# Patient Record
Sex: Female | Born: 1975 | Race: Black or African American | Hispanic: No | Marital: Single | Smoking: Never smoker
Health system: Southern US, Community
[De-identification: ages and names within clinical notes are randomized; demographics above are authoritative.]

---

## 2004-04-27 ENCOUNTER — Other Ambulatory Visit: Admission: RE | Admit: 2004-04-27 | Discharge: 2004-04-27 | Payer: Self-pay

## 2005-06-21 ENCOUNTER — Other Ambulatory Visit: Admission: RE | Admit: 2005-06-21 | Discharge: 2005-06-21 | Payer: Self-pay | Admitting: Unknown Physician Specialty

## 2005-06-21 ENCOUNTER — Encounter (INDEPENDENT_AMBULATORY_CARE_PROVIDER_SITE_OTHER): Payer: Self-pay | Admitting: Specialist

## 2006-10-09 ENCOUNTER — Emergency Department (HOSPITAL_COMMUNITY): Admission: EM | Admit: 2006-10-09 | Discharge: 2006-10-09 | Payer: Self-pay | Admitting: Family Medicine

## 2006-10-10 ENCOUNTER — Emergency Department (HOSPITAL_COMMUNITY): Admission: EM | Admit: 2006-10-10 | Discharge: 2006-10-11 | Payer: Self-pay | Admitting: Emergency Medicine

## 2006-10-11 ENCOUNTER — Emergency Department (HOSPITAL_COMMUNITY): Admission: EM | Admit: 2006-10-11 | Discharge: 2006-10-11 | Payer: Self-pay | Admitting: Emergency Medicine

## 2006-10-30 ENCOUNTER — Encounter: Admission: RE | Admit: 2006-10-30 | Discharge: 2006-10-30 | Payer: Self-pay | Admitting: Otolaryngology

## 2006-11-05 ENCOUNTER — Emergency Department (HOSPITAL_COMMUNITY): Admission: EM | Admit: 2006-11-05 | Discharge: 2006-11-05 | Payer: Self-pay | Admitting: Emergency Medicine

## 2006-11-30 ENCOUNTER — Encounter: Admission: RE | Admit: 2006-11-30 | Discharge: 2006-11-30 | Payer: Self-pay | Admitting: Otolaryngology

## 2006-11-30 ENCOUNTER — Other Ambulatory Visit: Admission: RE | Admit: 2006-11-30 | Discharge: 2006-11-30 | Payer: Self-pay | Admitting: Interventional Radiology

## 2006-11-30 ENCOUNTER — Encounter (INDEPENDENT_AMBULATORY_CARE_PROVIDER_SITE_OTHER): Payer: Self-pay | Admitting: Interventional Radiology

## 2007-02-06 ENCOUNTER — Emergency Department (HOSPITAL_COMMUNITY): Admission: EM | Admit: 2007-02-06 | Discharge: 2007-02-06 | Payer: Self-pay | Admitting: Emergency Medicine

## 2007-02-16 ENCOUNTER — Emergency Department (HOSPITAL_COMMUNITY): Admission: EM | Admit: 2007-02-16 | Discharge: 2007-02-16 | Payer: Self-pay | Admitting: Family Medicine

## 2007-03-09 ENCOUNTER — Ambulatory Visit: Payer: Self-pay | Admitting: Cardiology

## 2007-03-09 LAB — CONVERTED CEMR LAB
Free T4: 0.8 ng/dL (ref 0.6–1.6)
TSH: 0.8 microintl units/mL (ref 0.35–5.50)

## 2007-03-13 ENCOUNTER — Ambulatory Visit: Payer: Self-pay | Admitting: Cardiology

## 2007-03-14 ENCOUNTER — Ambulatory Visit: Payer: Self-pay | Admitting: Internal Medicine

## 2007-03-27 ENCOUNTER — Ambulatory Visit: Payer: Self-pay

## 2007-04-02 ENCOUNTER — Emergency Department (HOSPITAL_COMMUNITY): Admission: EM | Admit: 2007-04-02 | Discharge: 2007-04-02 | Payer: Self-pay | Admitting: Emergency Medicine

## 2007-04-04 ENCOUNTER — Ambulatory Visit: Payer: Self-pay | Admitting: Cardiology

## 2007-04-25 ENCOUNTER — Ambulatory Visit: Payer: Self-pay | Admitting: *Deleted

## 2007-05-18 ENCOUNTER — Ambulatory Visit: Payer: Self-pay | Admitting: Family Medicine

## 2007-06-15 ENCOUNTER — Emergency Department (HOSPITAL_COMMUNITY): Admission: EM | Admit: 2007-06-15 | Discharge: 2007-06-16 | Payer: Self-pay | Admitting: Emergency Medicine

## 2007-07-11 ENCOUNTER — Emergency Department (HOSPITAL_COMMUNITY): Admission: EM | Admit: 2007-07-11 | Discharge: 2007-07-11 | Payer: Self-pay | Admitting: Emergency Medicine

## 2007-07-16 ENCOUNTER — Ambulatory Visit: Payer: Self-pay | Admitting: Internal Medicine

## 2007-07-16 LAB — CONVERTED CEMR LAB
ALT: 8 units/L (ref 0–35)
Alkaline Phosphatase: 41 units/L (ref 39–117)
Basophils Absolute: 0 10*3/uL (ref 0.0–0.1)
CO2: 24 meq/L (ref 19–32)
Eosinophils Absolute: 0.2 10*3/uL (ref 0.0–0.7)
Eosinophils Relative: 4 % (ref 0–5)
HCT: 36.9 % (ref 36.0–46.0)
Lymphocytes Relative: 44 % (ref 12–46)
Neutrophils Relative %: 44 % (ref 43–77)
Platelets: 312 10*3/uL (ref 150–400)
RDW: 14.4 % (ref 11.5–15.5)
Sodium: 140 meq/L (ref 135–145)
Total Bilirubin: 0.3 mg/dL (ref 0.3–1.2)
Total Protein: 7.7 g/dL (ref 6.0–8.3)

## 2007-07-17 ENCOUNTER — Ambulatory Visit (HOSPITAL_COMMUNITY): Admission: RE | Admit: 2007-07-17 | Discharge: 2007-07-17 | Payer: Self-pay | Admitting: Internal Medicine

## 2007-08-19 ENCOUNTER — Emergency Department (HOSPITAL_COMMUNITY): Admission: EM | Admit: 2007-08-19 | Discharge: 2007-08-19 | Payer: Self-pay | Admitting: Emergency Medicine

## 2007-10-19 ENCOUNTER — Ambulatory Visit: Payer: Self-pay | Admitting: Cardiology

## 2008-02-06 ENCOUNTER — Ambulatory Visit: Payer: Self-pay | Admitting: Internal Medicine

## 2008-02-06 ENCOUNTER — Encounter: Payer: Self-pay | Admitting: Internal Medicine

## 2008-02-20 ENCOUNTER — Ambulatory Visit (HOSPITAL_COMMUNITY): Admission: RE | Admit: 2008-02-20 | Discharge: 2008-02-20 | Payer: Self-pay | Admitting: Otolaryngology

## 2008-09-20 ENCOUNTER — Emergency Department (HOSPITAL_COMMUNITY): Admission: EM | Admit: 2008-09-20 | Discharge: 2008-09-20 | Payer: Self-pay | Admitting: Emergency Medicine

## 2008-09-27 ENCOUNTER — Emergency Department (HOSPITAL_COMMUNITY): Admission: EM | Admit: 2008-09-27 | Discharge: 2008-09-27 | Payer: Self-pay | Admitting: Family Medicine

## 2008-11-20 IMAGING — CT CT ANGIO CHEST
2 of 4 series · 19 of 36 positions shown · IV contrast (APPLIED)
Comparison: None.

CLINICAL DATA: 30-year-old, chest pain. 
 CT ANGIOGRAPHY OF CHEST:
TECHNIQUE: Multidetector CT imaging of the chest was performed during bolus injection of intravenous contrast.  Multiplanar CT angiographic image reconstructions were generated to evaluate the vascular anatomy.
 Contrast:  100 cc Omnipaque 300.

[Series 4: pulm embolism 2.0 st · axial · 0.62mm/px · z∈[+1082,+1384]mm · 16 of 165 slices shown]
[im 7/165  lung]
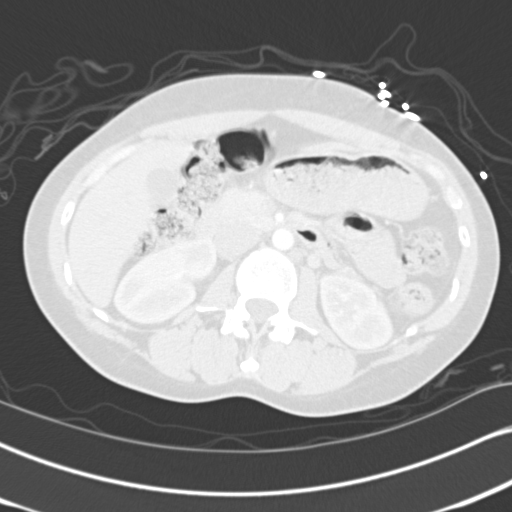
[im 20/165  mediastinal]
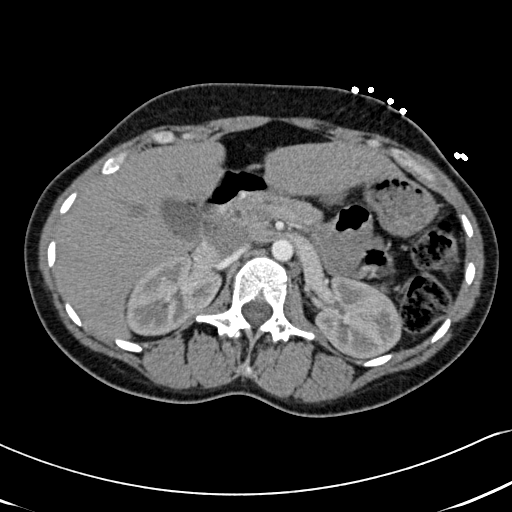
[im 27/165  lung]
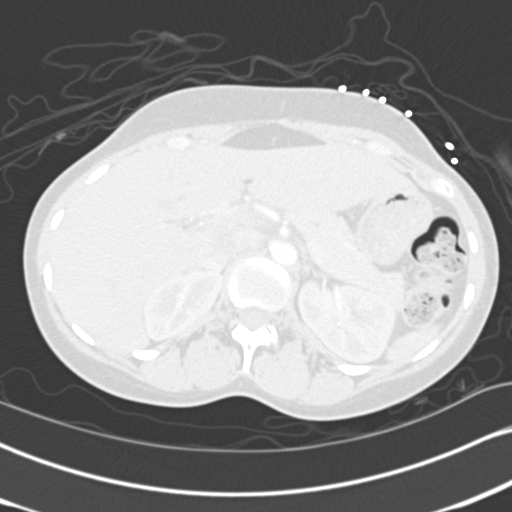
[im 40/165  mediastinal]
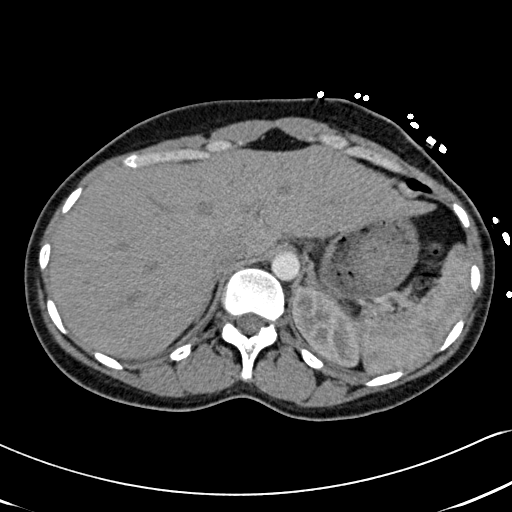
[im 46/165  lung]
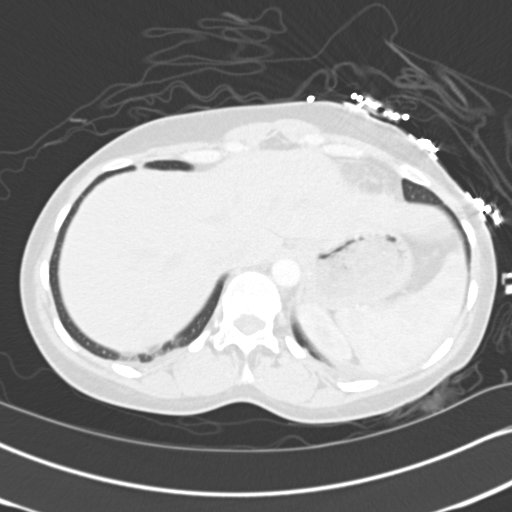
[im 60/165  mediastinal]
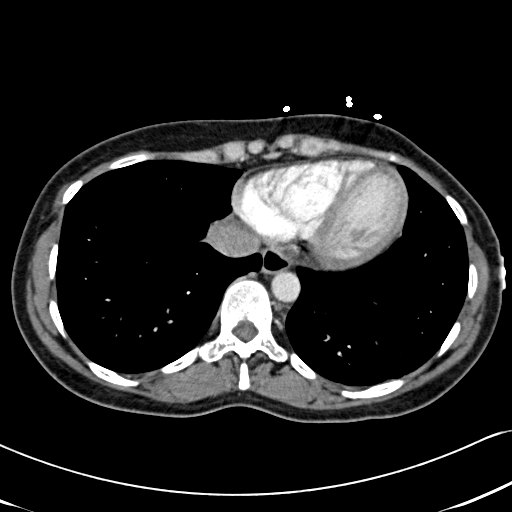
[im 66/165  lung]
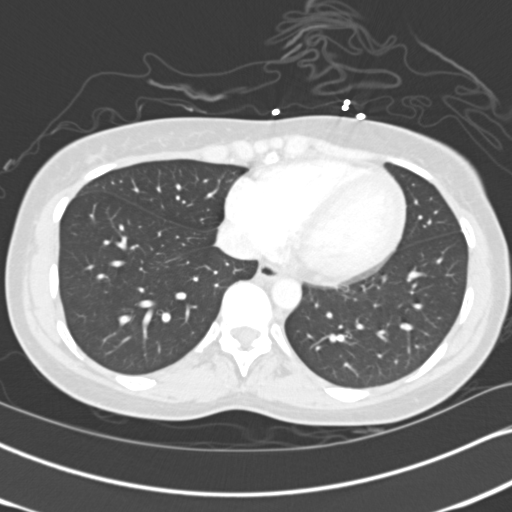
[im 79/165  mediastinal]
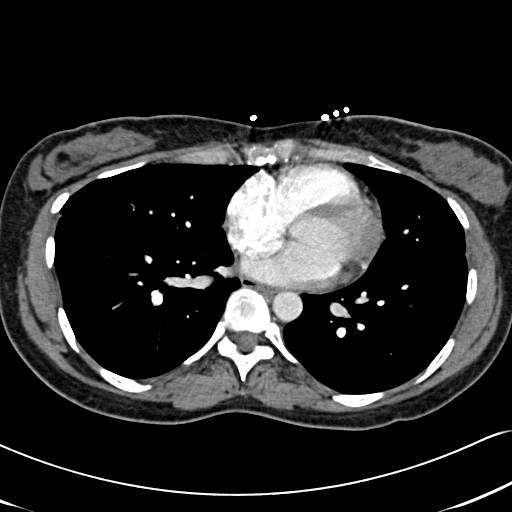
[im 86/165  lung]
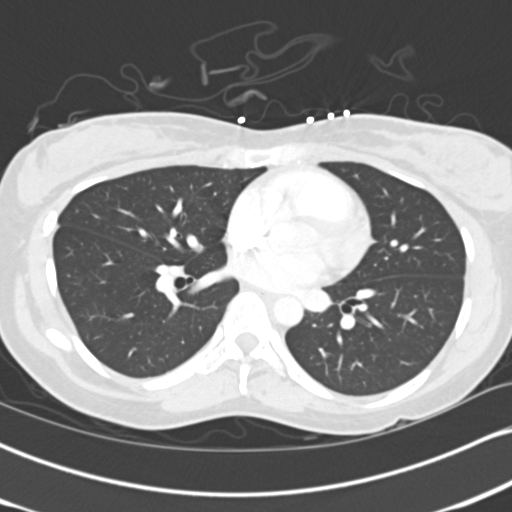
[im 99/165  mediastinal]
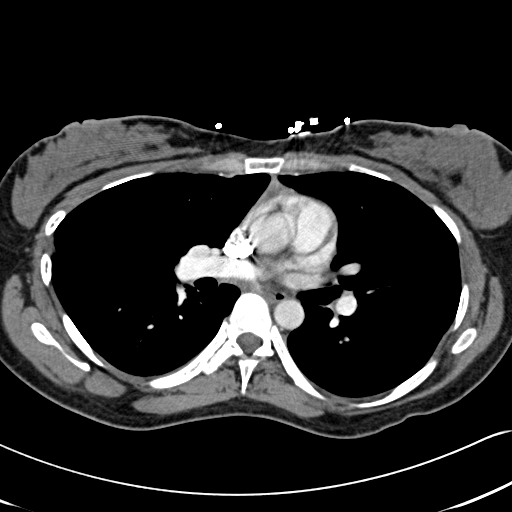
[im 105/165  lung]
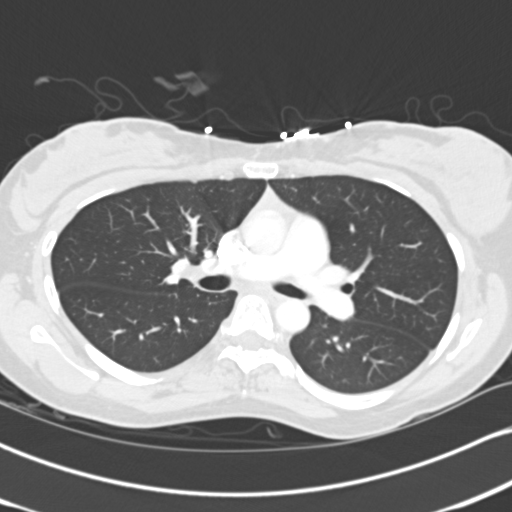
[im 119/165  mediastinal]
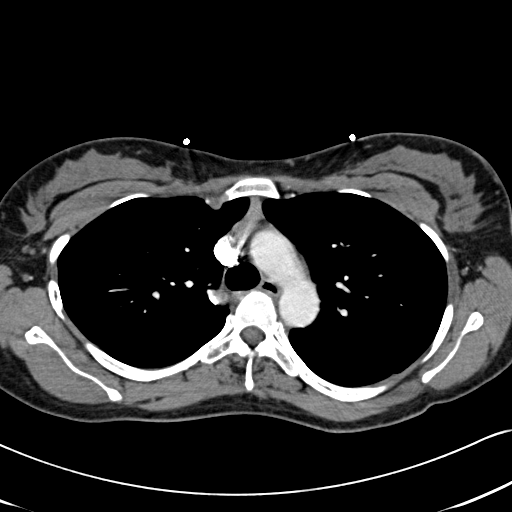
[im 125/165  lung]
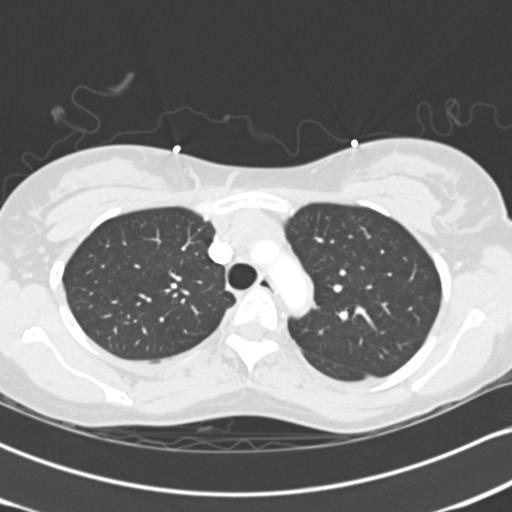
[im 138/165  mediastinal]
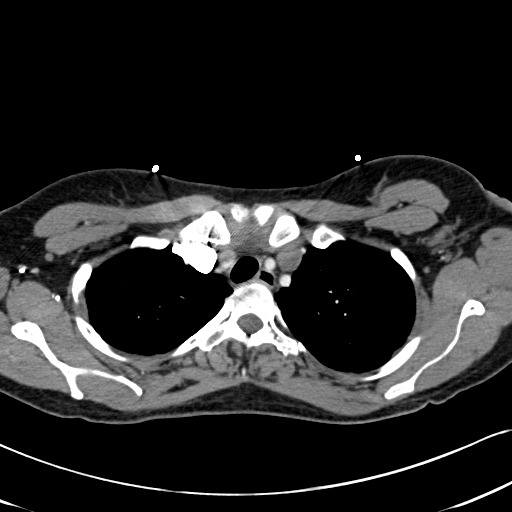
[im 145/165  lung]
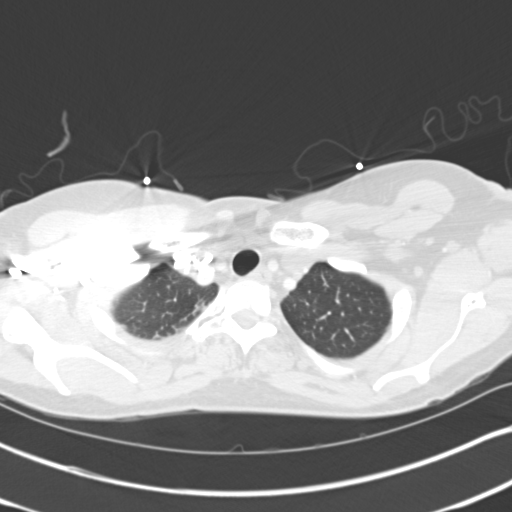
[im 158/165  mediastinal]
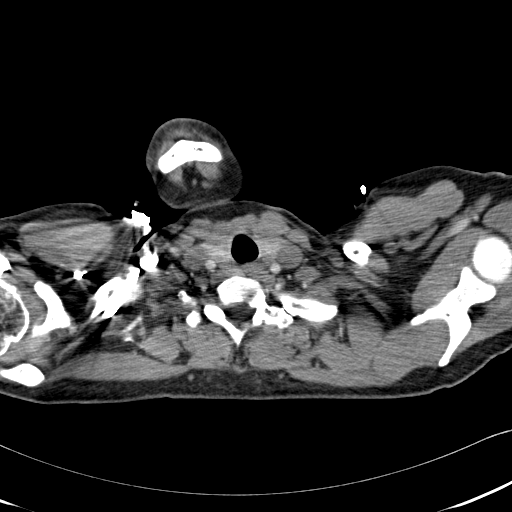

[Series 7: pulm embolism 2.0 cor · coronal · 0.64mm/px · 3 of 92 slices shown]
[im 19/92  mediastinal]
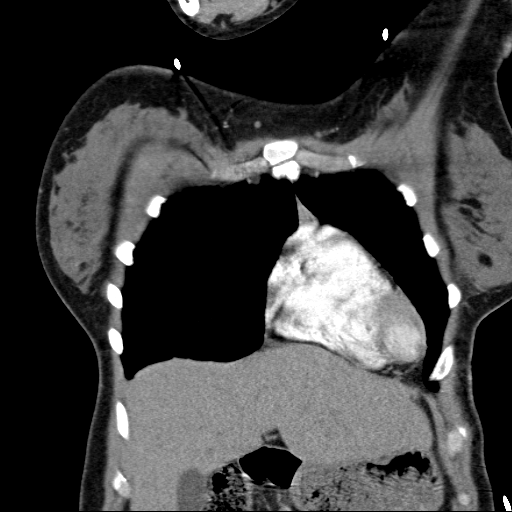
[im 37/92  mediastinal]
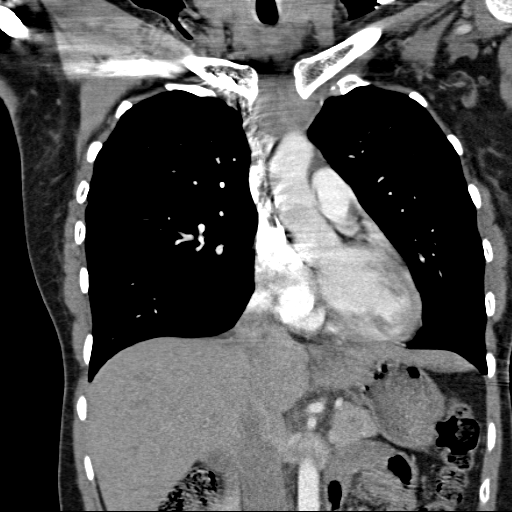
[im 55/92  mediastinal]
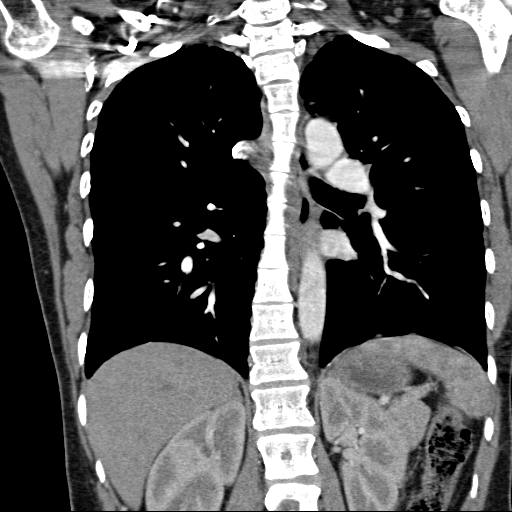

[19 of 36 positions shown; findings below may reference images not displayed]

FINDINGS: The chest wall, soft tissues, and bony structures are unremarkable.  No supraclavicular axillary adenopathy.  A right thyroid lesion is noted.  I would recommend ultrasound follow-up.  Maximum measurement is 13 mm.  
 Minimal residual thymic tissue in the anterior mediastinum.  Heart size is normal.  No pericardial effusion.  No mediastinal or hilar adenopathy.  Esophagus is grossly normal. 
 The aorta is normal in caliber.  No dissection. 
 The pulmonary arterial tree is well opacified.  No filling defects to suggest pulmonary emboli.  
 The lungs are clear of acute process.  Minimal apical scarring type change on the right side and minimal dependent atelectasis.  
 The upper abdomen demonstrates no significant findings.
IMPRESSION: 1.  No CT evidence for pulmonary emboli. 
 2.  No acute pulmonary findings. 
 3.  Normal thoracic aorta.
 4.  Right thyroid lobe lesion.  Recommend ultrasound correlation and follow-up.

## 2008-12-09 IMAGING — US US SOFT TISSUE HEAD/NECK
1 series · 14 of 25 positions shown · non-contrast
Comparison: No prior ultrasound.

CLINICAL DATA: Thyroid mass felt on exam. 
 THYROID ULTRASOUND:
TECHNIQUE: Ultrasound examination of the thyroid gland and adjacent soft tissue structures was performed.

[Series 1: us soft tissue head/neck · 0.07mm/px · 14 of 56 slices shown]
[im 1/56]
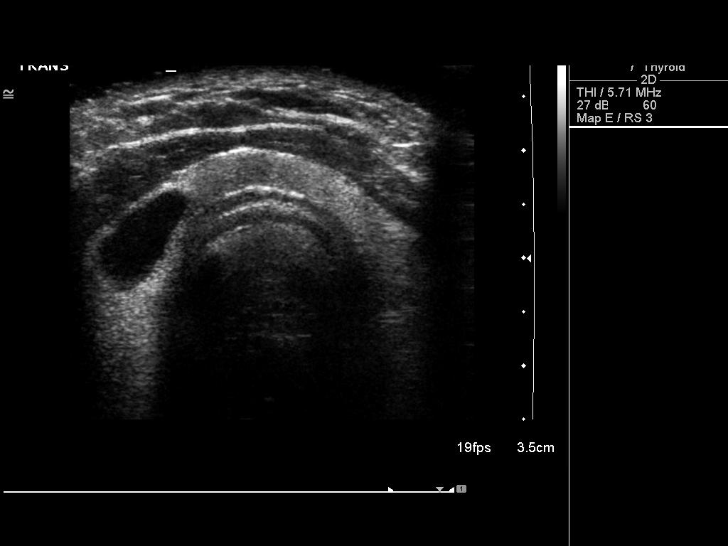
[im 5/56]
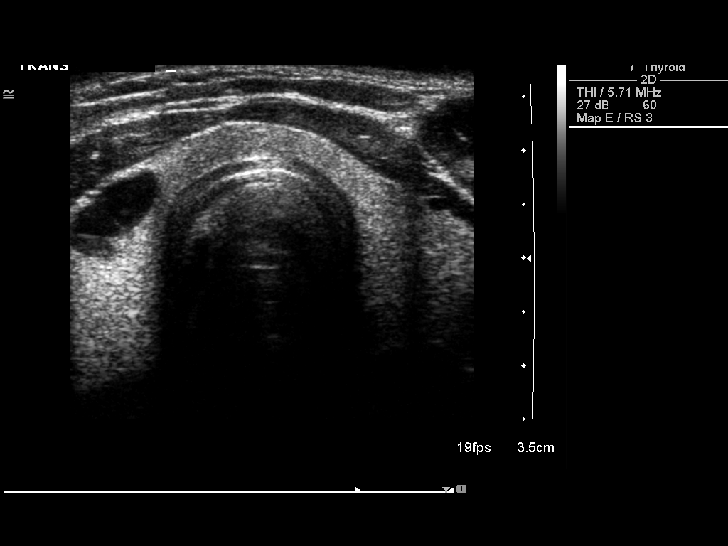
[im 10/56]
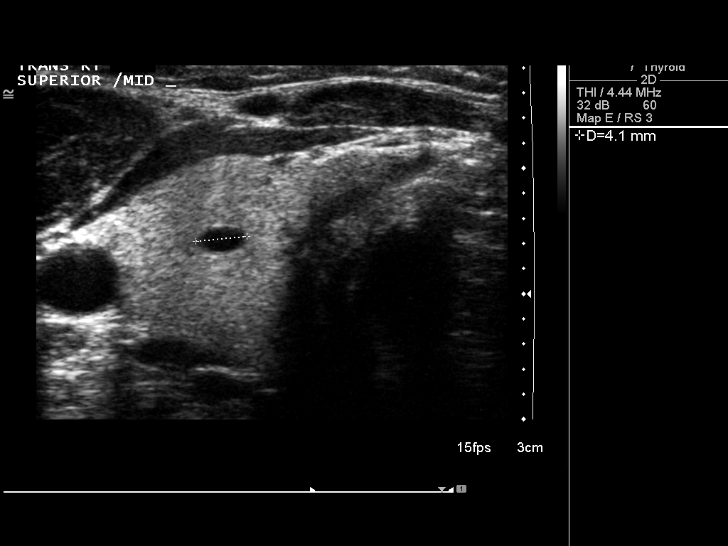
[im 14/56]
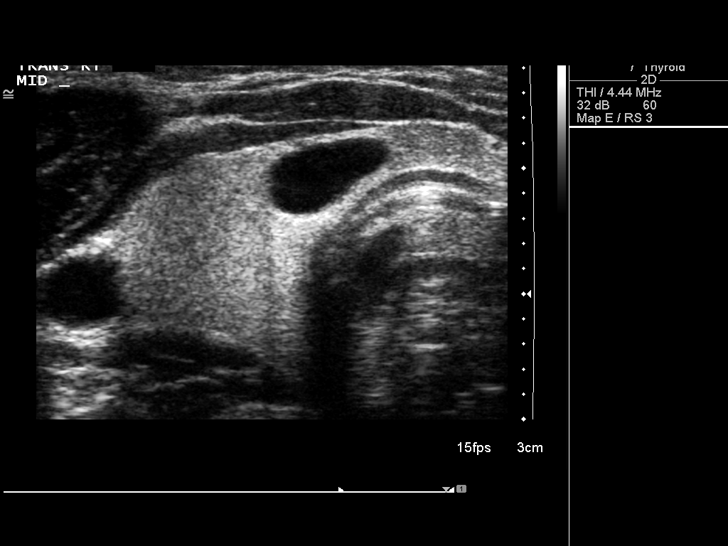
[im 19/56]
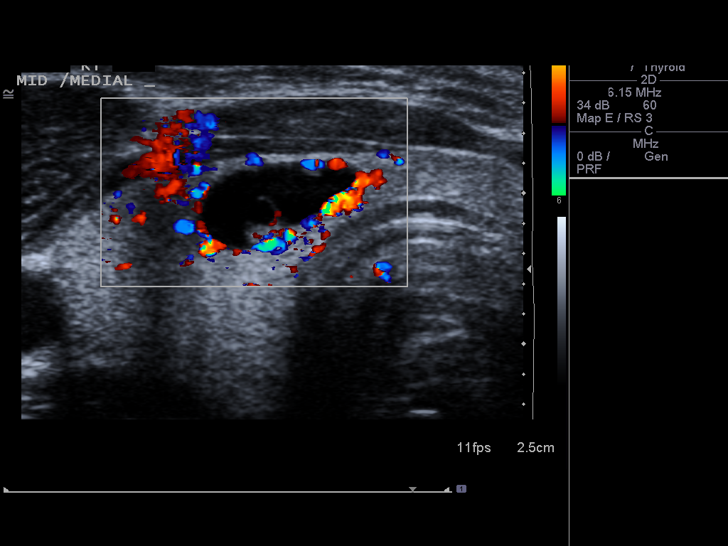
[im 21/56]
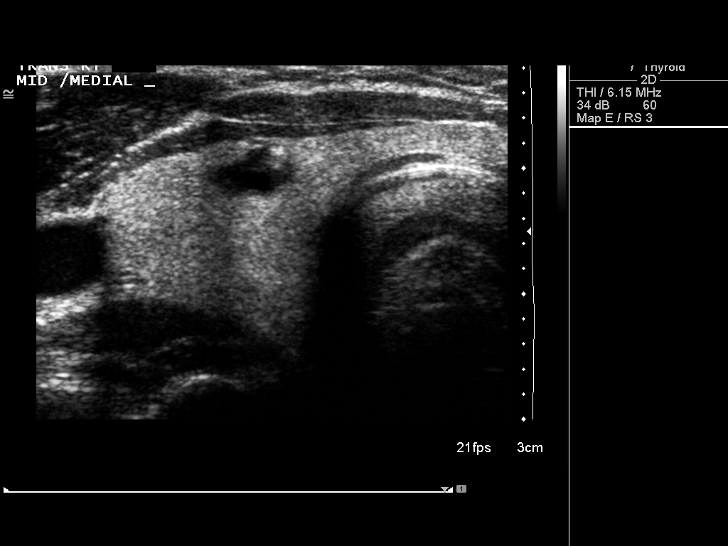
[im 26/56]
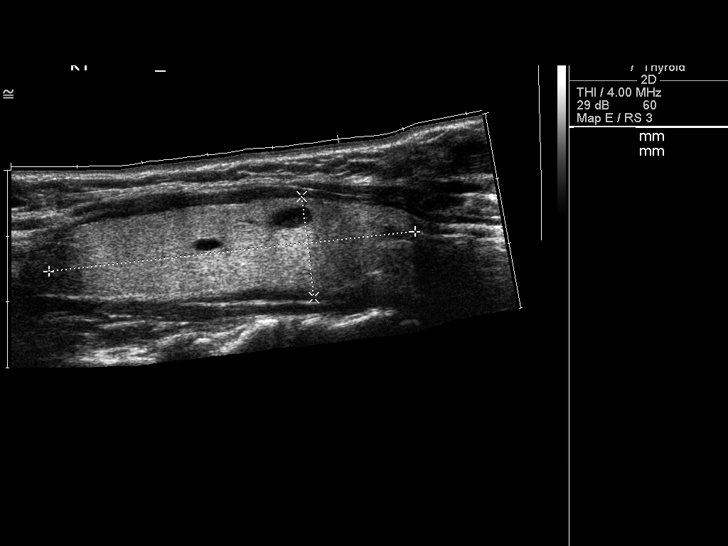
[im 30/56]
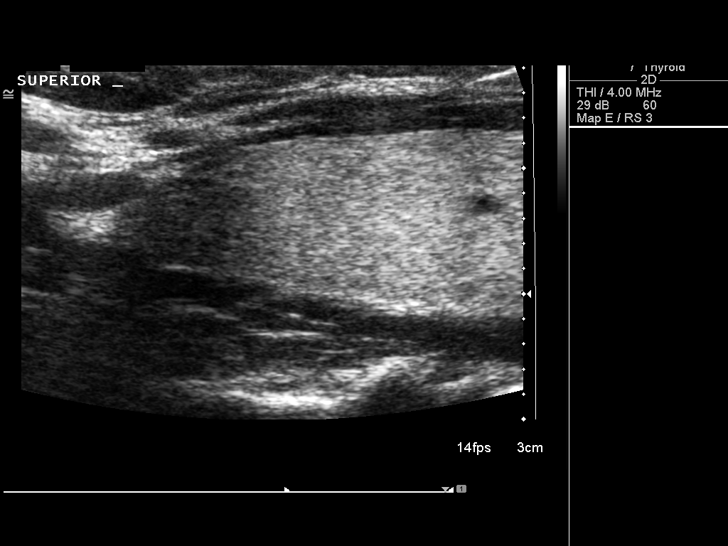
[im 35/56]
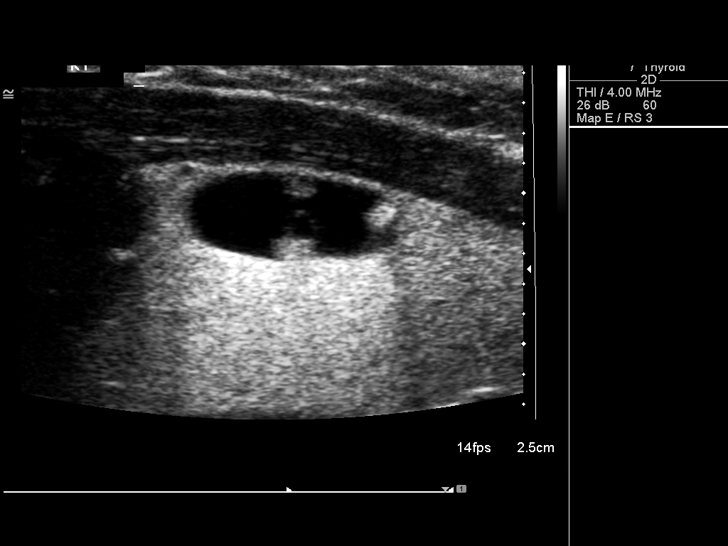
[im 37/56]
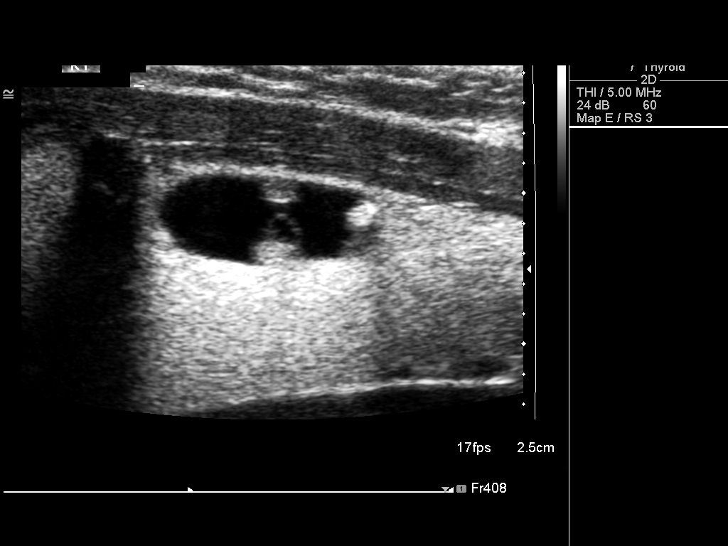
[im 42/56]
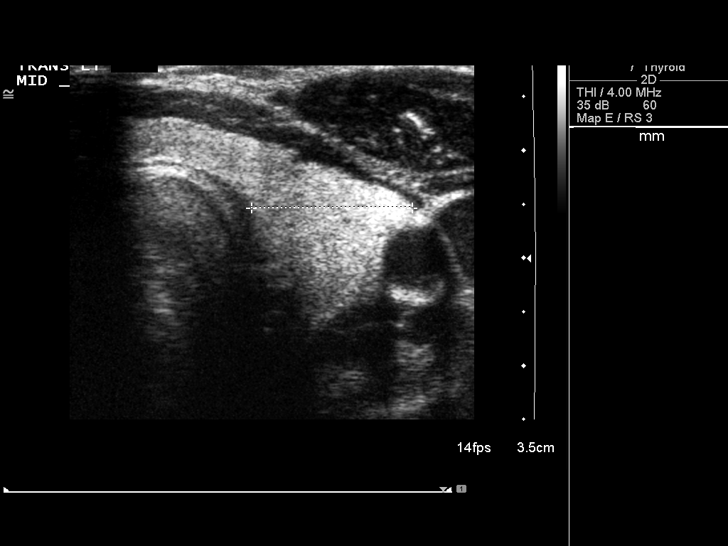
[im 46/56]
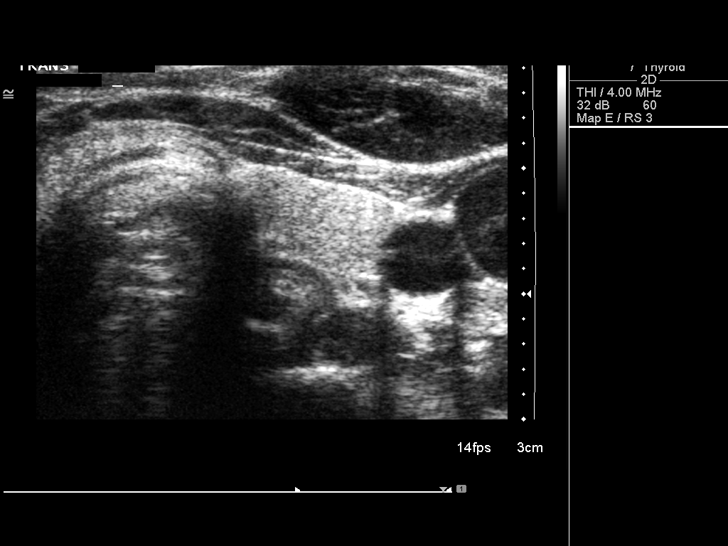
[im 51/56]
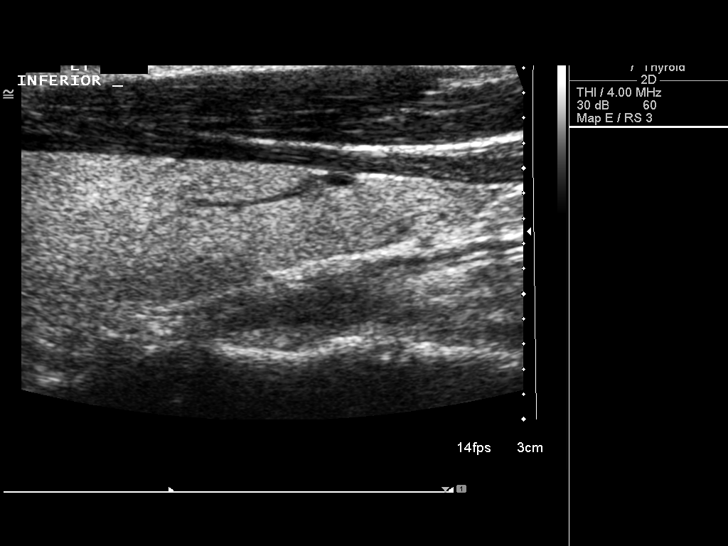
[im 56/56]
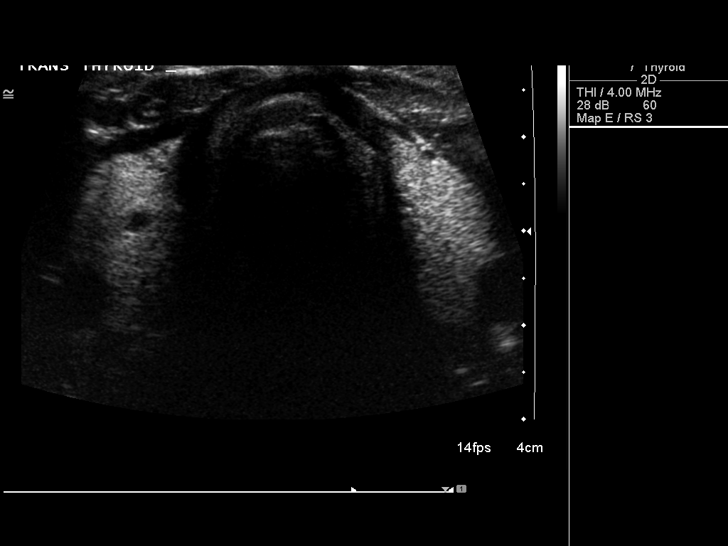

[14 of 25 positions shown; findings below may reference images not displayed]

The patient did have a CT scan of the chest on 10/11/06 showing a 13 mm lesion of the right thyroid.
 The gland size is somewhat enlarged. The right lobe is 5.6 x 1.6 x 1.8 cm.  The left is 6.0 x 1.5 x 1.6 cm.  
 In the medial aspect of the right lobe, adjacent to the isthmus, is a teardrop-shaped, complex cyst with septations and nodules.  It measures 1.5 x 0.6 x 1.3 cm.  This correlates with the lesion seen on CT.  Just above this level, there is a small ovoid cystic lesion measuring 4 mm.  
 We have no prior studies for comparison.  The lesion in the right lobe of the thyroid should either be followed sonographically or percutaneously aspirated.
IMPRESSION: 1.  The gland is mildly enlarged.
 2.  There is a complex cystic lesion in the medial aspect of the right lobe containing septations and nodules ? see above discussion.
 3.  There is a 4 mm cyst in the right lobe as well.

## 2009-08-26 ENCOUNTER — Emergency Department (HOSPITAL_COMMUNITY): Admission: EM | Admit: 2009-08-26 | Discharge: 2009-08-27 | Payer: Self-pay | Admitting: Emergency Medicine

## 2010-03-07 ENCOUNTER — Emergency Department (HOSPITAL_COMMUNITY)
Admission: EM | Admit: 2010-03-07 | Discharge: 2010-03-07 | Payer: Self-pay | Source: Home / Self Care | Admitting: Family Medicine

## 2010-04-18 ENCOUNTER — Encounter: Payer: Self-pay | Admitting: Otolaryngology

## 2010-04-19 ENCOUNTER — Encounter: Payer: Self-pay | Admitting: Otolaryngology

## 2010-06-14 LAB — WET PREP, GENITAL
Trich, Wet Prep: NONE SEEN
Yeast Wet Prep HPF POC: NONE SEEN

## 2010-06-14 LAB — BASIC METABOLIC PANEL
CO2: 25 mEq/L (ref 19–32)
GFR calc non Af Amer: >60 mL/min (ref >60)
Glucose, Bld: 93 mg/dL (ref 70–99)
Potassium: 3.4 mEq/L — ABNORMAL LOW (ref 3.5–5.1)
Sodium: 138 mEq/L (ref 135–145)

## 2010-06-14 LAB — URINALYSIS, ROUTINE W REFLEX MICROSCOPIC
Ketones, ur: NEGATIVE mg/dL
Nitrite: NEGATIVE
Urobilinogen, UA: 1 mg/dL (ref 0.0–1.0)

## 2010-06-14 LAB — RPR: RPR Ser Ql: NONREACTIVE

## 2010-06-14 LAB — CBC
HCT: 36.5 % (ref 36.0–46.0)
Hemoglobin: 12.3 g/dL (ref 12.0–15.0)
RDW: 14.4 % (ref 11.5–15.5)

## 2010-06-14 LAB — DIFFERENTIAL
Basophils Absolute: 0 10*3/uL (ref 0.0–0.1)
Eosinophils Relative: 2 % (ref 0–5)
Lymphocytes Relative: 26 % (ref 12–46)
Monocytes Absolute: 0.5 10*3/uL (ref 0.1–1.0)

## 2010-06-14 LAB — URINE MICROSCOPIC-ADD ON

## 2010-07-05 LAB — URINALYSIS, ROUTINE W REFLEX MICROSCOPIC
Bilirubin Urine: NEGATIVE
Glucose, UA: NEGATIVE mg/dL
Hgb urine dipstick: NEGATIVE
Specific Gravity, Urine: 1.007 (ref 1.005–1.030)
Urobilinogen, UA: 1 mg/dL (ref 0.0–1.0)
pH: 6.5 (ref 5.0–8.0)

## 2010-07-05 LAB — POCT I-STAT, CHEM 8
Chloride: 104 mEq/L (ref 96–112)
HCT: 39 % (ref 36.0–46.0)
Potassium: 3.6 mEq/L (ref 3.5–5.1)
Sodium: 139 mEq/L (ref 135–145)

## 2010-07-05 LAB — POCT PREGNANCY, URINE: Preg Test, Ur: NEGATIVE

## 2010-07-05 LAB — URINE MICROSCOPIC-ADD ON

## 2010-08-10 NOTE — Assessment & Plan Note (Signed)
[facility]                            CARDIOLOGY OFFICE NOTE   Tiffany Salas, Tiffany Salas                    MRN:          [id number]  DATE:03/09/2007                            DOB:          [birth year]    The patient is a 35 year old female who I am asked to evaluate for  palpitations and chest pain.   The patient has no prior cardiac history.  She typically does not have  dyspnea on exertion, orthopnea, PND, pedal edema, presyncope, syncope or  exertional chest pain.  Over the past 4 to 6 months, she has had  intermittent palpitations.  They are sudden in onset, and her heart is  racing.  This typically lasts for 2 to 3 minutes and resolves  spontaneously.  There is no associated chest pain, shortness of breath,  presyncope or syncope.  There are no exacerbating factors or alleviating  factors.  She also occasionally has chest pain.  She typically notices  this more after she eats and when she lies flat.  The pain is substernal  and described as an indigestion, and she thinks it is related to her  reflux.  It does not radiate.  The pain is not associated with shortness  of breath, nausea, vomiting, or diaphoresis.  Note, she does not have  exertional chest pain.  Because of the above, she presented for further  evaluation.  She is on no medications at present.  She has no known drug  allergies.   SOCIAL HISTORY:  She does not smoke at present.  She denies any drug  use.  She occasionally consumes alcohol.   FAMILY HISTORY:  Negative for coronary artery disease or sudden death.   PAST MEDICAL HISTORY:  There is no diabetes mellitus, hypertension or  hyperlipidemia.  She does have a recent evaluation for thyroid nodule,  and has had a biopsy done.  I do not have those records available.  She  does have a history of reflux.  She has no previous surgeries.  She has  1 child who is 20 years old.   REVIEW OF SYSTEMS:  There are no headaches or fevers or  chills.  There  is no productive cough or hemoptysis.  There is no dysphagia,  odynophagia, melena or hematochezia.  There is no dysuria or hematuria.  Her menstrual cycles are normal.  There is no rash or seizure activity.  There is no orthopnea, PND, or pedal edema.  There is no claudication.  The remaining systems are negative.   PHYSICAL EXAMINATION:  Blood pressure 117/79 and her pulse is 87.  She  weighs 137 pounds.  She is well developed and well nourished, no acute distress.  Her skin is warm and dry.  She does not appear to be depressed.  She does appear to be mildly  anxious on exam.  There is no peripheral clubbing.  Her HEENT is normal with normal eyelids.  Her neck is supple with a normal upstroke bilaterally and no bruits  noted.  There is no jugular venous distension and no thyromegaly is  noted.  Her chest is  clear to auscultation, normal expansion.  CARDIOVASCULAR EXAM:  Reveals a regular rate and rhythm, normal S1 and  S2.  There are no murmurs, rubs, or gallops noted.  ABDOMINAL EXAM:  Nontender, nondistended, positive bowel sounds, no  hepatosplenomegaly, no masses appreciated.  There is no abdominal bruit.  She has 2+ femoral pulses bilaterally, no bruits.  EXTREMITIES:  Show no edema, I could palpate no cords.  Shows 2+  dorsalis pedis pulses bilaterally.  Her neurological exam is grossly intact.   Her electrocardiogram shows a sinus rhythm at a rate of 89.  There are  no significant ST changes.   DIAGNOSES:  1. Palpitations:  Her symptoms sound concerning for a possible      supraventricular tachycardia.  We will provide CardioNet monitor to      further evaluate.  Note, I will also schedule her to have an      echocardiogram to quantify her left ventricular function.  I will      have her return in 4 to 6 weeks, and we will review the above      information.  If we do demonstrate a significant arrhythmia, we      could add a beta blocker versus referring  to one of our      electrophysiologists for ablation.  2. Atypical chest pain:  This does not appear to be cardiac, and      instead probable reflux.  Note, she does not have exertional chest      pain and electrocardiogram is normal.  We will not pursue further      ischemia evaluation at this point.  3. Recent thyroid nodule:  I will check a TSH, free T3 and free T4 to      see if this may be contributing to her palpitations.  4. I will see her back in 4 to 6 weeks.     Madolyn Frieze Jens Som, MD, [facility]  Electronically Signed    BSC/MedQ  DD: 03/09/2007  DT: 03/10/2007  Job #: [id number]

## 2010-08-10 NOTE — Assessment & Plan Note (Signed)
[facility]                            CARDIOLOGY OFFICE NOTE   Tiffany Salas, Tiffany Salas                    MRN:          [id number]  DATE:10/19/2007                            DOB:          07/03/1975    Tiffany Salas is a 35 year old female who has a history of palpitations.  Her previous echocardiogram performed on March 27, 2007, showed low  normal LV function with ejection fraction of 50-55%.  There was systolic  mitral bowing of the anterior leaflet, but no mitral valve prolapse and  there was mild mitral regurgitation and tricuspid regurgitation.  She  also had a CardioNet monitor that showed sinus rhythm with rare PAC.  She apparently had palpitations with this monitor in place.  Since I  last saw her, she continues to have palpitations.  They typically occur  in the middle of night.  They described as her heart racing for  approximately 30 seconds and then resolve spontaneously.  There is no  associated chest pain, shortness of breath, nausea or syncope.  She also  has had a previous TSH that was normal at 0.80 with a normal free T3 and  a normal free T4.   She is on no medications at present.   PHYSICAL EXAMINATION:  VITAL SIGNS:  Blood pressure of 118/75 and pulse  is 82.  HEENT:  Normal.  NECK:  Supple.  CHEST:  Clear.  CARDIOVASCULAR:  Regular rate and rhythm.  ABDOMEN:  No tenderness.  EXTREMITIES:  No edema.   Her electrocardiogram shows a sinus rhythm at a rate of 77.  There are  no ST changes noted.   DIAGNOSES:  1. Palpitations - we have not identified a significant arrhythmia on      her previous monitor and her LV function is normal.  Note, her TSH      is also normal.  I will add low-dose Toprol to see if this helps      with her symptoms.  We will begin with 12.5 mg p.o. daily.  I will      see her back in approximately 3 months to see if this has improved      her symptoms.  2. History of atypical chest pain - we have  felt that this was not      cardiac in etiology.  She is not complaining of chest pain since I      saw her in January.  3. History of thyroid nodule, status post biopsy - management per her      primary care physician.     Madolyn Frieze Jens Som, MD, [facility]  Electronically Signed    BSC/MedQ  DD: 10/19/2007  DT: 10/20/2007  Job #: [id number]

## 2010-08-10 NOTE — Assessment & Plan Note (Signed)
[facility]                            CARDIOLOGY OFFICE NOTE   Tiffany, Salas                    MRN:          [id number]  DATE:04/04/2007                            DOB:          29-Oct-1975    Tiffany Salas is a 35 year old female that I recently evaluated for  palpitations.  We scheduled her to have an event monitor.  This showed  sinus rhythm with a rare PAC.  She did state that she had 2 of the  episodes of palpitations while she had the monitor.  She also had an  echocardiogram which was performed on March 27, 2007.  Her LV  function was at the lower limits of normal with an ejection fraction of  50% - 55%.  There was systolic mitral bowing of the anterior leaflet but  no mitral valve prolapse was noted.  There was mild mitral regurgitation  and tricuspid regurgitation.  We also checked thyroid functions which  were normal.  Since then she denies any dyspnea on exertion, orthopnea,  PND, syncope or exertional chest pain.  She did state that she had some  indigestion after eating one day.  She has had brief palpitations as we  described previously.   PRESENT MEDICATIONS:  None.   PHYSICAL EXAM:  Shows a blood pressure of 98/70 and her pulse is 84.  She weighs 135 pounds.  HEENT:  Normal.  NECK:  Supple.  CHEST:  Clear.  CARDIOVASCULAR:  Reveals a regular rate and rhythm.  ABDOMINAL:  Shows no tenderness.  EXTREMITIES:  Show no edema.   DIAGNOSES:  1. Palpitations - Her event monitor did not show any significant      arrhythmias.  Her echocardiogram shows preserved left ventricular      function.  Her TSH is normal.  I have discussed the possibility of      adding low dose beta blockade to see if this would help with her      palpitations.  However, she would prefer to avoid this at present.      If her symptoms worsen then we can consider that in the future.  I      explained that we have not identified any significant rhythm    disturbance that would require intervention other than medical      therapy at this point.  I will see her back in 6 months to review      her symptoms.  2. History of atypical chest pain - We have felt that this is not      cardiac in etiology.  Note, she does not have exertional chest      pain.  3. History of thyroid nodule - Her TSH, free T3 and free T4 were      normal.     Madolyn Frieze. Jens Som, MD, [facility]  Electronically Signed    BSC/MedQ  DD: 04/04/2007  DT: 04/04/2007  Job #: [id number]

## 2010-12-10 ENCOUNTER — Inpatient Hospital Stay (INDEPENDENT_AMBULATORY_CARE_PROVIDER_SITE_OTHER)
Admission: RE | Admit: 2010-12-10 | Discharge: 2010-12-10 | Disposition: A | Discharge status: Home / Self Care | Payer: Self-pay | Source: Ambulatory Visit | Attending: Emergency Medicine | Admitting: Emergency Medicine

## 2010-12-10 DIAGNOSIS — M79609 Pain in unspecified limb: Secondary | ICD-10-CM

## 2011-01-10 LAB — I-STAT 8, (EC8 V) (CONVERTED LAB)
BUN: 5 — ABNORMAL LOW
Bicarbonate: 25.3 — ABNORMAL HIGH
Glucose, Bld: 89
TCO2: 27
pH, Ven: 7.308 — ABNORMAL HIGH

## 2011-01-10 LAB — URINALYSIS, ROUTINE W REFLEX MICROSCOPIC
Bilirubin Urine: NEGATIVE
Ketones, ur: NEGATIVE
Nitrite: NEGATIVE
Protein, ur: NEGATIVE
Specific Gravity, Urine: 1.006
Urobilinogen, UA: 0.2

## 2011-01-10 LAB — POCT I-STAT CREATININE: Creatinine, Ser: 0.9

## 2011-01-10 LAB — POCT CARDIAC MARKERS
CKMB, poc: <1 — ABNORMAL LOW
Myoglobin, poc: 36.6
Operator id: 282201

## 2011-01-10 LAB — D-DIMER, QUANTITATIVE: D-Dimer, Quant: 0.81 — ABNORMAL HIGH

## 2011-10-03 ENCOUNTER — Emergency Department (INDEPENDENT_AMBULATORY_CARE_PROVIDER_SITE_OTHER)
Admission: EM | Admit: 2011-10-03 | Discharge: 2011-10-03 | Disposition: A | Discharge status: Home / Self Care | Payer: Self-pay | Source: Home / Self Care | Attending: Family Medicine | Admitting: Family Medicine

## 2011-10-03 ENCOUNTER — Encounter (HOSPITAL_COMMUNITY): Payer: Self-pay | Admitting: Emergency Medicine

## 2011-10-03 ENCOUNTER — Emergency Department (INDEPENDENT_AMBULATORY_CARE_PROVIDER_SITE_OTHER): Payer: Self-pay

## 2011-10-03 DIAGNOSIS — M542 Cervicalgia: Secondary | ICD-10-CM

## 2011-10-03 MED ORDER — CYCLOBENZAPRINE HCL 5 MG PO TABS: 5.0000 mg | ORAL_TABLET | Freq: Three times a day (TID) | ORAL | Status: AC | PRN

## 2011-10-03 MED ORDER — DICLOFENAC POTASSIUM 50 MG PO TABS: 50.0000 mg | ORAL_TABLET | Freq: Three times a day (TID) | ORAL | Status: AC

## 2011-10-03 NOTE — ED Notes (Signed)
C/o neck pain, reports intermittent numbness in left hand and foot.  Seldom has a headache.  No known injury.

## 2011-10-03 NOTE — ED Provider Notes (Signed)
History     CSN: [id number]  Arrival date & time 10/03/11  1615   First MD Initiated Contact with Patient 10/03/11 1640      Chief Complaint  Patient presents with  . Neck Pain    (Consider location/radiation/quality/duration/timing/severity/associated sxs/prior treatment) Patient is a 36 y.o. female presenting with back pain. The history is provided by the patient.  Back Pain  This is a new problem. The current episode started more than 1 week ago (2 weeks of left neck pain). Episode frequency: intermittently. Associated with: fell and struck head with scalp lac sev yrs ago., had neg x-ray at that time. Pain location: left neck. The pain radiates to the left thigh (also left hand).    History reviewed. No pertinent past medical history.  History reviewed. No pertinent past surgical history.  No family history on file.  History  Substance Use Topics  . Smoking status: Never Smoker   . Smokeless tobacco: Not on file  . Alcohol Use: Yes    OB History    Grav Para Term Preterm Abortions TAB SAB Ect Mult Living                  Review of Systems  Constitutional: Negative.   HENT: Positive for neck pain.     Allergies  Review of patient's allergies indicates no known allergies.  Home Medications   Current Outpatient Rx  Name Route Sig Dispense Refill  . BIOTIN PO Oral Take by mouth.    . MULTIVITAMIN PO Oral Take by mouth.    . CYCLOBENZAPRINE HCL 5 MG PO TABS Oral Take 1 tablet (5 mg total) by mouth 3 (three) times daily as needed for muscle spasms. 30 tablet 0  . DICLOFENAC POTASSIUM 50 MG PO TABS Oral Take 1 tablet (50 mg total) by mouth 3 (three) times daily. 15 tablet 0    BP 113/78  Pulse 84  Temp 98.8 F (37.1 C) (Oral)  Resp 18  SpO2 100%  LMP 09/19/2011  Physical Exam  Nursing note and vitals reviewed. Constitutional: She is oriented to person, place, and time. She appears well-developed and well-nourished.  HENT:  Head: Normocephalic.  Neck:  Normal range of motion. Neck supple.       Left post cerv node.  Lymphadenopathy:    She has cervical adenopathy.  Neurological: She is alert and oriented to person, place, and time. No cranial nerve deficit. Coordination normal.  Skin: Skin is warm and dry.    ED Course  Procedures (including critical care time)  Labs Reviewed - No data to display Dg Cervical Spine With Flex & Extend  10/03/2011  *RADIOLOGY REPORT*  Clinical Data: 36 year old female status post fall with neck injury.  Pain.  CERVICAL SPINE COMPLETE WITH FLEXION AND EXTENSION VIEWS  Comparison: 08/19/2007.  Findings: Lateral views in neutral flexion and extension positioning.  Stable mild reversal of cervical lordosis in the neutral position.  Decreased range of motion in the lower cervical spine in extension, but normal range of motion in flexion.  No abnormal motion identified.  Bilateral posterior element alignment is within normal limits. Cervicothoracic junction alignment is within normal limits.  AP alignment C1-C2 alignment and odontoid are within normal limits.  IMPRESSION: No acute osseous abnormality in the cervical spine.  Decreased range of motion in the lower cervical spine in extension, but no abnormal motion to suggest ligamentous instability.  Original Report Authenticated By: Harley Hallmark, M.D.     1. Neck pain  on left side       MDM  X-rays reviewed and report per radiologist.         Linna Hoff, MD 10/03/11 1859

## 2011-11-03 ENCOUNTER — Ambulatory Visit: Payer: Self-pay | Admitting: Pediatrics

## 2012-12-10 ENCOUNTER — Emergency Department (HOSPITAL_COMMUNITY): Payer: Self-pay

## 2012-12-10 ENCOUNTER — Encounter (HOSPITAL_COMMUNITY): Payer: Self-pay | Admitting: Emergency Medicine

## 2012-12-10 ENCOUNTER — Emergency Department (HOSPITAL_COMMUNITY)
Admission: EM | Admit: 2012-12-10 | Discharge: 2012-12-11 | Disposition: A | Discharge status: Home / Self Care | Payer: Self-pay | Attending: Emergency Medicine | Admitting: Emergency Medicine

## 2012-12-10 DIAGNOSIS — S93401A Sprain of unspecified ligament of right ankle, initial encounter: Secondary | ICD-10-CM

## 2012-12-10 DIAGNOSIS — X500XXA Overexertion from strenuous movement or load, initial encounter: Secondary | ICD-10-CM | POA: Insufficient documentation

## 2012-12-10 DIAGNOSIS — Y9239 Other specified sports and athletic area as the place of occurrence of the external cause: Secondary | ICD-10-CM | POA: Insufficient documentation

## 2012-12-10 DIAGNOSIS — Y9367 Activity, basketball: Secondary | ICD-10-CM | POA: Insufficient documentation

## 2012-12-10 DIAGNOSIS — S93409A Sprain of unspecified ligament of unspecified ankle, initial encounter: Secondary | ICD-10-CM | POA: Insufficient documentation

## 2012-12-10 MED ORDER — IBUPROFEN 800 MG PO TABS: 800.0000 mg | ORAL_TABLET | Freq: Three times a day (TID) | ORAL | Status: DC | PRN

## 2012-12-10 NOTE — ED Notes (Signed)
Pt. injured left ankle while playing basketball this afternoon pain worse when ambulating / weight bearing.

## 2012-12-10 NOTE — ED Notes (Addendum)
Pt indicated to xray that it was her R ankle, xray order changed.

## 2012-12-10 NOTE — ED Provider Notes (Signed)
CSN: [id number]     Arrival date & time 12/10/12  2203 History  This chart was scribed for non-physician practitioner Charlestine Night, PA-C, working with Flint Melter, MD by Dorothey Baseman, ED Scribe. This patient was seen in room TR07C/TR07C and the patient's care was started at 11:24 PM.    Chief Complaint  Patient presents with  . Ankle Injury   The history is provided by the patient. No language interpreter was used.   HPI Comments: Tiffany Salas is a 37 y.o. female who presents to the Emergency Department complaining of an injury to her right ankle that occurred while playing basketball around 7 hours ago when she states that she heard a popping sound and twisted her ankle when she landed. She states that the pain is constant and exacerbated when walking and bearing weight. Patient denies any other symptoms at this time.   History reviewed. No pertinent past medical history. History reviewed. No pertinent past surgical history. No family history on file. History  Substance Use Topics  . Smoking status: Never Smoker   . Smokeless tobacco: Not on file  . Alcohol Use: Yes   OB History   Grav Para Term Preterm Abortions TAB SAB Ect Mult Living                 Review of Systems  A complete 10 system review of systems was obtained and all systems are negative except as noted in the HPI and PMH.   Allergies  Review of patient's allergies indicates no known allergies.  Home Medications   Current Outpatient Rx  Name  Route  Sig  Dispense  Refill  . BIOTIN PO   Oral   Take 1 tablet by mouth daily.          . Multiple Vitamins-Minerals (MULTIVITAMIN PO)   Oral   Take 1 tablet by mouth daily.           Triage Vitals: Temp(Src) 98.2 F (36.8 C) (Oral)  LMP 12/02/2012  Physical Exam  Nursing note and vitals reviewed. Constitutional: She is oriented to person, place, and time. She appears well-developed and well-nourished. No distress.  HENT:  Head: Normocephalic  and atraumatic.  Eyes: Conjunctivae are normal.  Neck: Normal range of motion. Neck supple.  Musculoskeletal: Normal range of motion. She exhibits edema and tenderness.  Lateral left ankle tenderness to palpation and swelling   Neurological: She is alert and oriented to person, place, and time.  Skin: Skin is warm and dry.  Psychiatric: She has a normal mood and affect. Her behavior is normal.    ED Course  Procedures (including critical care time)  DIAGNOSTIC STUDIES:   COORDINATION OF CARE: 11:27PM- Ordered x-ray of the right ankle. Discussed treatment plan with patient at bedside and patient verbalized agreement.     Labs Review Labs Reviewed - No data to display  Imaging Review Dg Ankle Complete Right  12/10/2012   CLINICAL DATA:  Right ankle pain.  EXAM: RIGHT ANKLE - COMPLETE 3+ VIEW  COMPARISON:  No priors.  FINDINGS: Three views of the right ankle demonstrate no acute displaced fracture, subluxation, dislocation, joint or soft tissue abnormality.  IMPRESSION: No acute radiographic abnormality of the right ankle.   Electronically Signed   By: Trudie Reed M.D.   On: 12/10/2012 22:49  Patient be treated for ankle sprain and advised ice and elevate her ankle  MDM    Carlyle Dolly, PA-C 12/10/12 2349

## 2012-12-11 NOTE — ED Provider Notes (Signed)
Medical screening examination/treatment/procedure(s) were performed by non-physician practitioner and as supervising physician I was immediately available for consultation/collaboration.  Tyrese Capriotti L Oney Folz, MD 12/11/12 1555 

## 2019-03-04 ENCOUNTER — Other Ambulatory Visit: Payer: Self-pay | Admitting: *Deleted

## 2019-03-04 DIAGNOSIS — N6310 Unspecified lump in the right breast, unspecified quadrant: Secondary | ICD-10-CM

## 2019-03-19 ENCOUNTER — Ambulatory Visit
Admission: RE | Admit: 2019-03-19 | Discharge: 2019-03-19 | Disposition: A | Discharge status: Home / Self Care | Payer: No Typology Code available for payment source | Source: Ambulatory Visit | Attending: Obstetrics and Gynecology | Admitting: Obstetrics and Gynecology

## 2019-03-19 ENCOUNTER — Other Ambulatory Visit: Payer: Self-pay

## 2019-03-19 ENCOUNTER — Other Ambulatory Visit: Payer: Self-pay | Admitting: Obstetrics and Gynecology

## 2019-03-19 ENCOUNTER — Ambulatory Visit (HOSPITAL_COMMUNITY)
Admission: RE | Admit: 2019-03-19 | Discharge: 2019-03-19 | Disposition: A | Discharge status: Home / Self Care | Payer: Medicaid Other | Source: Ambulatory Visit | Attending: Obstetrics and Gynecology | Admitting: Obstetrics and Gynecology

## 2019-03-19 ENCOUNTER — Encounter (HOSPITAL_COMMUNITY): Payer: Self-pay

## 2019-03-19 DIAGNOSIS — Z1239 Encounter for other screening for malignant neoplasm of breast: Secondary | ICD-10-CM | POA: Insufficient documentation

## 2019-03-19 DIAGNOSIS — N6310 Unspecified lump in the right breast, unspecified quadrant: Secondary | ICD-10-CM

## 2019-03-19 DIAGNOSIS — R921 Mammographic calcification found on diagnostic imaging of breast: Secondary | ICD-10-CM

## 2019-03-19 NOTE — Progress Notes (Signed)
Complaints of right breast lump x 7 weeks that has decreased in size.   Pap Smear: Pap smear not completed today. Last Pap smear was in November 2020 at the [facility] and normal per patient. Per patient has a history of an abnormal Pap smear at least 7 years ago that a colposcopy was completed for follow-up. Patient states all Pap smears have been normal since colposcopy and has had at least two normal Pap smears. Last Pap smear result is not in Epic. Last Pap smear result in Epic is from 02/06/2008.  Physical exam: Breasts Breasts symmetrical. No skin abnormalities bilateral breasts. No nipple retraction bilateral breasts. No nipple discharge bilateral breasts. No lymphadenopathy. No lumps palpated left breast. Palpated a firm area within the right breast at 10 o'clock next to areola at patients area of concern. No complaints of pain or tenderness on exam. Referred patient to the [facility] for a diagnostic mammogram and possible right breast ultrasound. Appointment scheduled for Tuesday, March 19, 2019 at 1520.        Pelvic/Bimanual No Pap smear completed today since last Pap smear was in November 2020 per patient. Pap smear not indicated per [facility] guidelines.   Smoking History: Patient has never smoked.  Patient Navigation: Patient education provided. Access to services provided for patient through [facility] program.   Breast and Cervical Cancer Risk Assessment: Patient has no family history of breast cancer, known genetic mutations, or radiation treatment to the chest before age 11. Per patient has a history of cervical dysplasia. Patient has no history of being immunocompromised or DES exposure in-utero.  Risk Assessment    Risk Scores      03/19/2019   Last edited by: Loletta Parish, RN   5-year risk: 0.5 %   Lifetime risk: 7.4 %

## 2019-03-19 NOTE — Patient Instructions (Signed)
Explained breast self awareness with Valeda Malm. Patient did not need a Pap smear today due to last Pap smear was in November 2020 per patient. Let patient know that if she has only had two normal Pap smears since colposcopy that her next Pap smear will be due in one year. Referred patient to the [facility] for a diagnostic mammogram and possible right breast ultrasound. Appointment scheduled for Tuesday, March 19, 2019 at 1520. Patient aware of appointment and will be there. Valeda Malm verbalized understanding.  Sohana Austell, Arvil Chaco, RN 2:24 PM

## 2019-03-27 ENCOUNTER — Ambulatory Visit
Admission: RE | Admit: 2019-03-27 | Discharge: 2019-03-27 | Disposition: A | Discharge status: Home / Self Care | Payer: No Typology Code available for payment source | Source: Ambulatory Visit | Attending: Obstetrics and Gynecology | Admitting: Obstetrics and Gynecology

## 2019-03-27 ENCOUNTER — Other Ambulatory Visit: Payer: Self-pay

## 2019-03-27 DIAGNOSIS — R921 Mammographic calcification found on diagnostic imaging of breast: Secondary | ICD-10-CM

## 2019-03-28 ENCOUNTER — Other Ambulatory Visit: Payer: Self-pay | Admitting: Obstetrics and Gynecology

## 2019-03-28 DIAGNOSIS — N6489 Other specified disorders of breast: Secondary | ICD-10-CM

## 2019-04-04 ENCOUNTER — Other Ambulatory Visit: Payer: No Typology Code available for payment source

## 2019-04-08 ENCOUNTER — Other Ambulatory Visit: Payer: No Typology Code available for payment source

## 2019-04-12 ENCOUNTER — Ambulatory Visit: Payer: Self-pay | Admitting: Surgery

## 2019-04-12 DIAGNOSIS — N6310 Unspecified lump in the right breast, unspecified quadrant: Secondary | ICD-10-CM

## 2019-04-19 ENCOUNTER — Other Ambulatory Visit: Payer: No Typology Code available for payment source

## 2019-04-26 ENCOUNTER — Other Ambulatory Visit: Payer: Self-pay

## 2019-04-26 ENCOUNTER — Ambulatory Visit
Admission: RE | Admit: 2019-04-26 | Discharge: 2019-04-26 | Disposition: A | Discharge status: Home / Self Care | Payer: No Typology Code available for payment source | Source: Ambulatory Visit | Attending: Obstetrics and Gynecology | Admitting: Obstetrics and Gynecology

## 2019-04-26 DIAGNOSIS — N6489 Other specified disorders of breast: Secondary | ICD-10-CM

## 2019-12-12 ENCOUNTER — Other Ambulatory Visit: Payer: Self-pay | Admitting: Obstetrics and Gynecology

## 2019-12-12 DIAGNOSIS — N6310 Unspecified lump in the right breast, unspecified quadrant: Secondary | ICD-10-CM

## 2019-12-27 ENCOUNTER — Ambulatory Visit
Admission: RE | Admit: 2019-12-27 | Discharge: 2019-12-27 | Disposition: A | Discharge status: Home / Self Care | Payer: No Typology Code available for payment source | Source: Ambulatory Visit | Attending: Obstetrics and Gynecology | Admitting: Obstetrics and Gynecology

## 2019-12-27 ENCOUNTER — Other Ambulatory Visit: Payer: Self-pay | Admitting: Obstetrics and Gynecology

## 2019-12-27 ENCOUNTER — Other Ambulatory Visit: Payer: Self-pay

## 2019-12-27 DIAGNOSIS — N6310 Unspecified lump in the right breast, unspecified quadrant: Secondary | ICD-10-CM

## 2020-04-03 ENCOUNTER — Telehealth: Payer: Self-pay

## 2020-04-03 NOTE — Telephone Encounter (Signed)
Telephoned patient at home number. Patient did not answer, unable to leave contact information from [facility].

## 2021-07-05 ENCOUNTER — Other Ambulatory Visit: Payer: Self-pay

## 2021-07-05 ENCOUNTER — Emergency Department (HOSPITAL_COMMUNITY): Payer: 59

## 2021-07-05 ENCOUNTER — Encounter (HOSPITAL_COMMUNITY): Payer: Self-pay | Admitting: *Deleted

## 2021-07-05 ENCOUNTER — Emergency Department (HOSPITAL_COMMUNITY)
Admission: EM | Admit: 2021-07-05 | Discharge: 2021-07-05 | Disposition: A | Discharge status: Home / Self Care | Payer: 59 | Attending: Emergency Medicine | Admitting: Emergency Medicine

## 2021-07-05 DIAGNOSIS — R0781 Pleurodynia: Secondary | ICD-10-CM | POA: Diagnosis present

## 2021-07-05 DIAGNOSIS — R0789 Other chest pain: Secondary | ICD-10-CM

## 2021-07-05 DIAGNOSIS — R071 Chest pain on breathing: Secondary | ICD-10-CM | POA: Diagnosis not present

## 2021-07-05 MED ORDER — IBUPROFEN 600 MG PO TABS: 600.0000 mg | ORAL_TABLET | Freq: Three times a day (TID) | ORAL | 0 refills | Status: AC

## 2021-07-05 NOTE — ED Triage Notes (Signed)
Pain in right lateral rib cage after coughing, hurts to move for over a week ?

## 2021-07-05 NOTE — Discharge Instructions (Addendum)
Your chest x-ray and your EKG today are normal and your exam suggest that this is a chest wall source of pain, probably inflamed cartilage from your recent cold.  You do not have pneumonia.  I read recommend using the ibuprofen, I also suggest applying a gentle heating pad for 20 minutes several times daily.  Please follow-up with your primary provider if this does not completely resolve your symptoms over the next week or so. ?

## 2021-07-05 NOTE — ED Provider Notes (Signed)
?[facility] ?Provider Note ? ? ?CSN: [id number] ?Arrival date & time: 07/05/21  1029 ? ?  ? ?History ? ?Chief Complaint  ?Patient presents with  ? right rib cage pain  ? ? ?Tiffany Salas is a 46 y.o. female presenting for evaluation of right rib cage pain which has been present for a little bit more than a week.  She describes having a cough with URI type symptoms which started about 2 weeks ago and have resolved but has this residual localized pain.  She describes pain is triggered by deep inspiration but not always, initially she states it was constant and worse when she was supine, the supine symptom has resolved and the pain is milder in intensity.  Describes as sharp initially, now achy.  She can reproduce the pain with direct palpation.  She denies shortness of breath, palpitations, also denies diaphoresis, fevers or chills.  There is no radiation of pain.  She denies abdominal pain, nausea or vomiting.  She states she had worn a new bra and thought maybe this was causing localized discomfort so she stopped wearing this, also states she has a spring on her mattress that is about to come through and thought maybe she laid on the site, therefore she flipped her mattress but her pain has not resolved although as noted above it is improving.  She has had no peripheral edema.  She has had no treatment for her symptoms prior to arrival. ? ?The history is provided by the patient.  ? ?  ? ?Home Medications ?Prior to Admission medications   ?Medication Sig Start Date End Date Taking? Authorizing Provider  ?ibuprofen (ADVIL) 600 MG tablet Take 1 tablet (600 mg total) by mouth 3 (three) times daily. 07/05/21  Yes Burgess Amor, PA-C  ?Multiple Vitamins-Minerals (MULTIVITAMIN PO) Take 1 tablet by mouth daily.    Yes [provider]  ?   ? ?Allergies    ?Patient has no known allergies.   ? ?Review of Systems   ?Review of Systems  ?Constitutional:  Negative for chills and fever.  ?HENT:   Negative for congestion and sore throat.   ?Eyes: Negative.   ?Respiratory:  Negative for chest tightness and shortness of breath.   ?Cardiovascular:  Positive for chest pain. Negative for palpitations and leg swelling.  ?Gastrointestinal:  Negative for abdominal pain and nausea.  ?Genitourinary: Negative.   ?Musculoskeletal:  Negative for arthralgias, joint swelling and neck pain.  ?Skin: Negative.  Negative for rash and wound.  ?Neurological:  Negative for dizziness, weakness, light-headedness, numbness and headaches.  ?Psychiatric/Behavioral: Negative.    ? ?Physical Exam ?Updated Vital Signs ?BP (!) 143/98   Pulse 69   Temp 98.6 ?F (37 ?C) (Oral)   Resp 14   Ht 5\' 6"  (1.676 m)   Wt 70.3 kg   LMP 06/21/2021   SpO2 100%   BMI 25.02 kg/m?  ?Physical Exam ?Vitals and nursing note reviewed.  ?Constitutional:   ?   Appearance: She is well-developed.  ?HENT:  ?   Head: Normocephalic and atraumatic.  ?Eyes:  ?   Conjunctiva/sclera: Conjunctivae normal.  ?Cardiovascular:  ?   Rate and Rhythm: Normal rate and regular rhythm.  ?   Heart sounds: Normal heart sounds.  ?Pulmonary:  ?   Effort: Pulmonary effort is normal.  ?   Breath sounds: Normal breath sounds. No wheezing.  ?Chest:  ?   Chest wall: Tenderness present.  ? ? ?   Comments: Focal tenderness  to palpation right anterior lateral rib cage beneath right breast.  No crepitus.  No rash or erythema. ?Abdominal:  ?   General: Abdomen is flat. Bowel sounds are normal.  ?   Palpations: Abdomen is soft.  ?   Tenderness: There is no abdominal tenderness.  ?Musculoskeletal:     ?   General: No swelling or tenderness. Normal range of motion.  ?   Cervical back: Normal range of motion.  ?   Right lower leg: No edema.  ?   Left lower leg: No edema.  ?Skin: ?   General: Skin is warm and dry.  ?Neurological:  ?   General: No focal deficit present.  ?   Mental Status: She is alert.  ? ? ?ED Results / Procedures / Treatments   ?Labs ?(all labs ordered are listed, but only  abnormal results are displayed) ?Labs Reviewed  ?POC URINE PREG, ED  ? ? ?EKG ?EKG Interpretation ? ?Date/Time:  Monday July 05 2021 13:28:07 EDT ?Ventricular Rate:  73 ?PR Interval:  166 ?QRS Duration: 80 ?QT Interval:  386 ?QTC Calculation: 426 ?R Axis:   40 ?Text Interpretation: Sinus rhythm No change Confirmed by Coralee PesaHorton, Kristie [phone]([id number]) on 07/05/2021 2:50:57 PM ? ?Radiology ?DG Ribs Unilateral W/Chest Right ? ?Result Date: 07/05/2021 ?CLINICAL DATA:  Pleuritic pain EXAM: RIGHT RIBS AND CHEST - 3+ VIEW COMPARISON:  Chest x-ray 10/11/2006 FINDINGS: No fracture or other bone lesions are seen involving the ribs. There is no evidence of pneumothorax or pleural effusion. Both lungs are clear. Heart size and mediastinal contours are within normal limits. IMPRESSION: No rib fracture or acute intrathoracic process identified. Electronically Signed   By: Jannifer Hickelaney  Williams M.D.   On: 07/05/2021 14:41   ? ?Procedures ?Procedures  ? ? ?Medications Ordered in ED ?Medications - No data to display ? ?ED Course/ Medical Decision Making/ A&P ?  ?                        ?Medical Decision Making ?Patient with intermittent episodes of localized right chest pain which is reproducible, also pleuritic, but again it is intermittent and appears to be triggered by position.  She has no risk factors, exam findings to suggest acute PE.  PE would not be intermittent as well.  No peripheral edema.  EKG is normal sinus rhythm, chest x-ray is clear.  Patient is tender at the right anterior lateral mid rib cage, reproducible, no crepitus.  Favors costochondritis. ? ?Patient was advised anti-inflammatories, heat therapy.  She does have a routine appointment with her PCP next week which would be good to recheck this pain as well.  As patient states this pain is already improving from its maximum intensity, I suspect it will continue to do the same. ? ?Amount and/or Complexity of Data Reviewed ?Radiology: ordered. ?   Details: Chest x-ray reviewed  and negative for bony lesions, lungs are clear. ?ECG/medicine tests: ordered. ?   Details: Normal sinus rhythm. ? ? ? ? ? ? ? ? ? ? ?Final Clinical Impression(s) / ED Diagnoses ?Final diagnoses:  ?Costochondral chest pain  ? ? ?Rx / DC Orders ?ED Discharge Orders   ? ?      Ordered  ?  ibuprofen (ADVIL) 600 MG tablet  3 times daily       ? 07/05/21 1455  ? ?  ?  ? ?  ? ? ?  ?Burgess Amordol, Freja Faro, PA-C ?07/05/21 1519 ? ?  ?Horton, Danford BadKristie  M, DO ?07/05/21 1528 ? ?
